# Patient Record
Sex: Female | Born: 1960 | Race: Black or African American | Hispanic: No | Marital: Married
Health system: Southern US, Community
[De-identification: ages and names within clinical notes are randomized; demographics above are authoritative.]

## PROBLEM LIST (undated history)

## (undated) DIAGNOSIS — D649 Anemia, unspecified: Secondary | ICD-10-CM

## (undated) HISTORY — PX: ECTOPIC PREGNANCY SURGERY: SHX613

## (undated) HISTORY — PX: OTHER SURGICAL HISTORY: SHX169

## (undated) HISTORY — PX: ABDOMINAL HYSTERECTOMY: SHX81

## (undated) HISTORY — PX: COLONOSCOPY: SHX174

---

## 2005-08-07 ENCOUNTER — Ambulatory Visit: Payer: Self-pay | Admitting: Family Medicine

## 2006-08-21 ENCOUNTER — Ambulatory Visit: Payer: Self-pay | Admitting: Family Medicine

## 2007-08-24 ENCOUNTER — Ambulatory Visit: Payer: Self-pay | Admitting: Family Medicine

## 2008-09-08 ENCOUNTER — Ambulatory Visit: Payer: Self-pay | Admitting: Family Medicine

## 2009-09-12 ENCOUNTER — Ambulatory Visit: Payer: Self-pay | Admitting: Family Medicine

## 2010-02-23 ENCOUNTER — Emergency Department: Payer: Self-pay | Admitting: Unknown Physician Specialty

## 2010-03-26 ENCOUNTER — Ambulatory Visit: Payer: Self-pay | Admitting: Gastroenterology

## 2010-09-17 ENCOUNTER — Ambulatory Visit: Payer: Self-pay | Admitting: Family Medicine

## 2011-07-13 ENCOUNTER — Emergency Department: Payer: Self-pay | Admitting: Unknown Physician Specialty

## 2011-10-10 ENCOUNTER — Ambulatory Visit: Payer: Self-pay | Admitting: Family Medicine

## 2012-07-07 ENCOUNTER — Emergency Department: Payer: Self-pay | Admitting: Emergency Medicine

## 2012-11-11 ENCOUNTER — Emergency Department: Payer: Self-pay | Admitting: Emergency Medicine

## 2012-11-11 LAB — CK TOTAL AND CKMB (NOT AT ARMC)
CK, Total: 130 U/L (ref 21–215)
CK-MB: <0.5 ng/mL — ABNORMAL LOW (ref 0.5–3.6)

## 2012-11-11 LAB — COMPREHENSIVE METABOLIC PANEL
Albumin: 4.4 g/dL (ref 3.4–5.0)
Alkaline Phosphatase: 75 U/L (ref 50–136)
Anion Gap: 5 — ABNORMAL LOW (ref 7–16)
Bilirubin,Total: 0.3 mg/dL (ref 0.2–1.0)
Calcium, Total: 9 mg/dL (ref 8.5–10.1)
Glucose: 89 mg/dL (ref 65–99)
Osmolality: 274 (ref 275–301)
Potassium: 4.1 mmol/L (ref 3.5–5.1)
SGOT(AST): 20 U/L (ref 15–37)
SGPT (ALT): 19 U/L (ref 12–78)
Total Protein: 8.2 g/dL (ref 6.4–8.2)

## 2012-11-11 LAB — TROPONIN I: Troponin-I: <0.02 ng/mL

## 2012-11-11 LAB — CBC
MCHC: 32.7 g/dL (ref 32.0–36.0)
MCV: 101 fL — ABNORMAL HIGH (ref 80–100)
RBC: 3.81 10*6/uL (ref 3.80–5.20)
RDW: 13.6 % (ref 11.5–14.5)

## 2013-07-18 ENCOUNTER — Ambulatory Visit: Payer: Self-pay | Admitting: Family Medicine

## 2015-07-03 ENCOUNTER — Other Ambulatory Visit: Payer: Self-pay | Admitting: Family Medicine

## 2015-07-03 DIAGNOSIS — Z1231 Encounter for screening mammogram for malignant neoplasm of breast: Secondary | ICD-10-CM

## 2015-07-05 ENCOUNTER — Encounter: Payer: Self-pay | Admitting: *Deleted

## 2015-07-06 ENCOUNTER — Ambulatory Visit
Admission: RE | Admit: 2015-07-06 | Discharge: 2015-07-06 | Disposition: A | Discharge status: Home / Self Care | Payer: 59 | Source: Ambulatory Visit | Attending: Gastroenterology | Admitting: Gastroenterology

## 2015-07-06 ENCOUNTER — Ambulatory Visit: Payer: 59 | Admitting: Anesthesiology

## 2015-07-06 ENCOUNTER — Encounter
Admission: RE | Disposition: A | Discharge status: Home / Self Care | Payer: Self-pay | Source: Ambulatory Visit | Attending: Gastroenterology

## 2015-07-06 ENCOUNTER — Encounter: Payer: Self-pay | Admitting: Anesthesiology

## 2015-07-06 DIAGNOSIS — Z1211 Encounter for screening for malignant neoplasm of colon: Secondary | ICD-10-CM | POA: Diagnosis present

## 2015-07-06 DIAGNOSIS — K621 Rectal polyp: Secondary | ICD-10-CM | POA: Diagnosis not present

## 2015-07-06 DIAGNOSIS — Z800 Family history of malignant neoplasm of digestive organs: Secondary | ICD-10-CM | POA: Diagnosis not present

## 2015-07-06 DIAGNOSIS — K635 Polyp of colon: Secondary | ICD-10-CM | POA: Diagnosis not present

## 2015-07-06 HISTORY — DX: Anemia, unspecified: D64.9

## 2015-07-06 HISTORY — PX: COLONOSCOPY WITH PROPOFOL: SHX5780

## 2015-07-06 SURGERY — COLONOSCOPY WITH PROPOFOL
Anesthesia: General

## 2015-07-06 MED ORDER — PROPOFOL 500 MG/50ML IV EMUL
INTRAVENOUS | Status: DC | PRN
  Administered 2015-07-06: 150 ug/kg/min via INTRAVENOUS

## 2015-07-06 MED ORDER — LIDOCAINE HCL (CARDIAC) 20 MG/ML IV SOLN
INTRAVENOUS | Status: DC | PRN
  Administered 2015-07-06: 100 mg via INTRAVENOUS

## 2015-07-06 MED ORDER — SODIUM CHLORIDE 0.9 % IV SOLN
INTRAVENOUS | Status: DC
  Administered 2015-07-06: 12:00:00 via INTRAVENOUS

## 2015-07-06 MED ORDER — LIDOCAINE HCL (PF) 1 % IJ SOLN: 2.0000 mL | Freq: Once | INTRAMUSCULAR | Status: DC

## 2015-07-06 MED ORDER — PROPOFOL 10 MG/ML IV BOLUS
INTRAVENOUS | Status: DC | PRN
  Administered 2015-07-06: 80 mg via INTRAVENOUS

## 2015-07-06 MED ORDER — LIDOCAINE HCL (PF) 1 % IJ SOLN
INTRAMUSCULAR | Status: AC
  Filled 2015-07-06: qty 2

## 2015-07-06 NOTE — H&P (Signed)
    Primary Care Physician:  Jerl MinaHEDRICK, JAMES, MD Primary Gastroenterologist:  Dr. Bluford Kaufmannh  Pre-Procedure History & Physical: HPI:  Terri Harvey is a 54 y.o. female is here for an colonoscopy.   Past Medical History  Diagnosis Date  . Anemia     Past Surgical History  Procedure Laterality Date  . Abdominal hysterectomy    . Left knee arthroscopy Left   . Ectopic pregnancy surgery    . Colonoscopy      Prior to Admission medications   Medication Sig Start Date End Date Taking? Authorizing Provider  fluticasone (FLONASE) 50 MCG/ACT nasal spray Place into both nostrils daily.   Yes Historical Provider, MD    Allergies as of 05/18/2015  . (Not on File)    No family history on file.  Social History   Social History  . Marital Status: Married    Spouse Name: N/A  . Number of Children: N/A  . Years of Education: N/A   Occupational History  . Not on file.   Social History Main Topics  . Smoking status: Not on file  . Smokeless tobacco: Never Used  . Alcohol Use: No  . Drug Use: No  . Sexual Activity: Not on file   Other Topics Concern  . Not on file   Social History Narrative  . No narrative on file    Review of Systems: See HPI, otherwise negative ROS  Physical Exam: There were no vitals taken for this visit. General:   Alert,  pleasant and cooperative in NAD Head:  Normocephalic and atraumatic. Neck:  Supple; no masses or thyromegaly. Lungs:  Clear throughout to auscultation.    Heart:  Regular rate and rhythm. Abdomen:  Soft, nontender and nondistended. Normal bowel sounds, without guarding, and without rebound.   Neurologic:  Alert and  oriented x4;  grossly normal neurologically.  Impression/Plan: Terri Harvey is here for an colononoscopy to be performed for family hx of colon cancer.  Risks, benefits, limitations, and alternatives regarding  colonoscopy have been reviewed with the patient.  Questions have been answered.  All parties agreeable.   Cody Albus,  Ezzard StandingPAUL Y, MD  07/06/2015, 10:41 AM

## 2015-07-06 NOTE — Transfer of Care (Signed)
Immediate Anesthesia Transfer of Care Note  Patient: Terri Harvey  Procedure(s) Performed: Procedure(s): COLONOSCOPY WITH PROPOFOL (N/A)  Patient Location: Endoscopy Unit  Anesthesia Type:General  Level of Consciousness: sedated  Airway & Oxygen Therapy: Patient Spontanous Breathing and Patient connected to nasal cannula oxygen  Post-op Assessment: Report given to RN and Post -op Vital signs reviewed and stable  Post vital signs: Reviewed and stable  Last Vitals: There were no vitals filed for this visit.  Complications: No apparent anesthesia complications

## 2015-07-06 NOTE — Anesthesia Preprocedure Evaluation (Signed)
Anesthesia Evaluation  Patient identified by MRN, date of birth, ID band Patient awake    Reviewed: Allergy & Precautions, H&P , NPO status , Patient's Chart, lab work & pertinent test results  History of Anesthesia Complications (+) DIFFICULT IV STICK / SPECIAL LINE and history of anesthetic complications  Airway Mallampati: II  TM Distance: >3 FB Neck ROM: full    Dental no notable dental hx. (+) Teeth Intact   Pulmonary neg pulmonary ROS,    Pulmonary exam normal breath sounds clear to auscultation       Cardiovascular Exercise Tolerance: Good (-) angina(-) Past MI and (-) DOE negative cardio ROS Normal cardiovascular exam Rhythm:regular Rate:Normal     Neuro/Psych negative neurological ROS  negative psych ROS   GI/Hepatic negative GI ROS, Neg liver ROS, neg GERD  ,  Endo/Other  negative endocrine ROS  Renal/GU negative Renal ROS  negative genitourinary   Musculoskeletal   Abdominal   Peds  Hematology negative hematology ROS (+)   Anesthesia Other Findings Past Medical History:   Anemia                                                      Past Surgical History:   ABDOMINAL HYSTERECTOMY                                        left knee arthroscopy                           Left              ECTOPIC PREGNANCY SURGERY                                     COLONOSCOPY                                                  BMI    Body Mass Index   29.06 kg/m 2      Reproductive/Obstetrics negative OB ROS                             Anesthesia Physical Anesthesia Plan  ASA: II  Anesthesia Plan: General   Post-op Pain Management:    Induction:   Airway Management Planned:   Additional Equipment:   Intra-op Plan:   Post-operative Plan:   Informed Consent: I have reviewed the patients History and Physical, chart, labs and discussed the procedure including the risks, benefits and  alternatives for the proposed anesthesia with the patient or authorized representative who has indicated his/her understanding and acceptance.   Dental Advisory Given  Plan Discussed with: Anesthesiologist, CRNA and Surgeon  Anesthesia Plan Comments:         Anesthesia Quick Evaluation

## 2015-07-06 NOTE — Op Note (Signed)
[facility] Patient Name: Terri Harvey Date: 07/06/2015 11:41 AM MRN: [id number] Account #: [id number] Date of Birth: November 28, 1960 Admit Type: Outpatient Age: [age] [facility] Gender: Female Note Status: Finalized Harvey:         Colonoscopy Indications:       Screening in patient at increased risk: Family history of                     1st-degree relative with colorectal cancer Providers:         Ezzard StandingPaul Y. Bluford Kaufmannh, MD Referring MD:      Rhona LeavensJames F. Burnett ShengHedrick, MD (Referring MD) Medicines:         Monitored Anesthesia Care Complications:     No immediate complications. Harvey:         Pre-Anesthesia Assessment:                    - Prior to the Harvey, a History and Physical was                     performed, and patient medications, allergies and                     sensitivities were reviewed. The patient's tolerance of                     previous anesthesia was reviewed.                    - The risks and benefits of the Harvey and the sedation                     options and risks were discussed with the patient. All                     questions were answered and informed consent was obtained.                    - After reviewing the risks and benefits, the patient was                     deemed in satisfactory condition to undergo the Harvey.                    After obtaining informed consent, the colonoscope was                     passed under direct vision. Throughout the Harvey, the                     patient's blood pressure, pulse, and oxygen saturations                     were monitored continuously. The Colonoscope was                     introduced through the anus and advanced to the the cecum,                     identified by appendiceal orifice and ileocecal valve. The                     colonoscopy was performed with moderate difficulty due to  restricted mobility of the colon.  Successful completion of                     the Harvey was aided by using manual pressure. Findings:      A diminutive polyp was found in the sigmoid colon. The polyp was       sessile. The polyp was removed with a jumbo cold forceps. Resection and       retrieval were complete.      Three sessile polyps were found in the rectum. The polyps were       diminutive in size. These polyps were removed with a jumbo cold forceps.       Resection and retrieval were complete.      The exam was otherwise without abnormality. Impression:        - One diminutive polyp in the sigmoid colon. Resected and                     retrieved.                    - Three diminutive polyps in the rectum. Resected and                     retrieved.                    - The examination was otherwise normal. Recommendation:    - Discharge patient to home.                    - Await pathology results.                    - Repeat colonoscopy in 5 years for surveillance.                    - The findings and recommendations were discussed with the                     patient. Harvey Code(s): --- Professional ---                    [phone], Colonoscopy, flexible; with biopsy, single or                     multiple Diagnosis Code(s): --- Professional ---                    Z80.0, Family history of malignant neoplasm of digestive                     organs                    D12.5, Benign neoplasm of sigmoid colon                    K62.1, Rectal polyp CPT copyright 2014 [employer]. All rights reserved. The codes documented in this report are preliminary and upon coder review may  be revised to meet current compliance requirements. Wallace Cullens, MD 07/06/2015 12:03:01 PM This report has been signed electronically. Number of Addenda: 0 Note Initiated On: 07/06/2015 11:41 AM Scope Withdrawal Time: 0 hours 5 minutes 37 seconds  Total Harvey Duration: 0 hours 15 minutes 29 seconds        [facility]

## 2015-07-06 NOTE — Anesthesia Postprocedure Evaluation (Signed)
  Anesthesia Post-op Note  Patient: Terri StandardSusan Martinovich  Procedure(s) Performed: Procedure(s): COLONOSCOPY WITH PROPOFOL (N/A)  Anesthesia type:General  Patient location: PACU  Post pain: Pain level controlled  Post assessment: Post-op Vital signs reviewed, Patient's Cardiovascular Status Stable, Respiratory Function Stable, Patent Airway and No signs of Nausea or vomiting  Post vital signs: Reviewed and stable  Last Vitals:  Filed Vitals:   07/06/15 1240  BP: 118/67  Pulse: 60  Temp:   Resp: 12    Level of consciousness: awake, alert  and patient cooperative  Complications: No apparent anesthesia complications

## 2015-07-09 LAB — SURGICAL PATHOLOGY

## 2015-07-11 ENCOUNTER — Encounter: Payer: Self-pay | Admitting: Gastroenterology

## 2015-08-15 ENCOUNTER — Ambulatory Visit
Admission: RE | Admit: 2015-08-15 | Discharge: 2015-08-15 | Disposition: A | Discharge status: Home / Self Care | Payer: 59 | Source: Ambulatory Visit | Attending: Family Medicine | Admitting: Family Medicine

## 2015-08-15 DIAGNOSIS — Z1231 Encounter for screening mammogram for malignant neoplasm of breast: Secondary | ICD-10-CM | POA: Diagnosis present

## 2016-08-19 ENCOUNTER — Other Ambulatory Visit: Payer: Self-pay | Admitting: Family Medicine

## 2016-08-19 DIAGNOSIS — Z1231 Encounter for screening mammogram for malignant neoplasm of breast: Secondary | ICD-10-CM

## 2016-10-02 ENCOUNTER — Ambulatory Visit: Payer: 59

## 2016-11-10 ENCOUNTER — Ambulatory Visit: Payer: 59 | Attending: Family Medicine

## 2018-01-06 ENCOUNTER — Other Ambulatory Visit: Payer: Self-pay | Admitting: Family Medicine

## 2018-01-06 DIAGNOSIS — Z1231 Encounter for screening mammogram for malignant neoplasm of breast: Secondary | ICD-10-CM

## 2018-03-05 ENCOUNTER — Ambulatory Visit
Admission: RE | Admit: 2018-03-05 | Discharge: 2018-03-05 | Disposition: A | Discharge status: Home / Self Care | Payer: Managed Care, Other (non HMO) | Source: Ambulatory Visit | Attending: Family Medicine | Admitting: Family Medicine

## 2018-03-05 DIAGNOSIS — Z1231 Encounter for screening mammogram for malignant neoplasm of breast: Secondary | ICD-10-CM | POA: Insufficient documentation

## 2020-01-04 ENCOUNTER — Other Ambulatory Visit: Payer: Self-pay | Admitting: Family Medicine

## 2020-01-04 DIAGNOSIS — Z1231 Encounter for screening mammogram for malignant neoplasm of breast: Secondary | ICD-10-CM

## 2020-01-09 ENCOUNTER — Ambulatory Visit
Admission: RE | Admit: 2020-01-09 | Discharge: 2020-01-09 | Disposition: A | Discharge status: Home / Self Care | Payer: Managed Care, Other (non HMO) | Source: Ambulatory Visit | Attending: Family Medicine | Admitting: Family Medicine

## 2020-01-09 DIAGNOSIS — Z1231 Encounter for screening mammogram for malignant neoplasm of breast: Secondary | ICD-10-CM | POA: Insufficient documentation

## 2020-02-14 ENCOUNTER — Ambulatory Visit: Payer: Managed Care, Other (non HMO) | Attending: Internal Medicine

## 2020-02-14 DIAGNOSIS — Z23 Encounter for immunization: Secondary | ICD-10-CM

## 2020-02-14 NOTE — Progress Notes (Signed)
   Covid-19 Vaccination Clinic  Name:  Terri Harvey    MRN: [id number] DOB: 1961/07/01  02/14/2020  [name] was observed post Covid-19 immunization for 15 minutes without incident. She was provided with Vaccine Information Sheet and instruction to access the V-Safe system.   [name] was instructed to call 911 with any severe reactions post vaccine: Marland Kitchen Difficulty breathing  . Swelling of face and throat  . A fast heartbeat  . A bad rash all over body  . Dizziness and weakness   Immunizations Administered    Name Date Dose VIS Date Route   Pfizer COVID-19 Vaccine 02/14/2020  5:57 PM 0.3 mL 11/16/2018 Intramuscular   Manufacturer: [employer], [employer]   Lot: [id number]   NDC: [address]-5

## 2020-03-06 ENCOUNTER — Ambulatory Visit: Payer: Managed Care, Other (non HMO) | Attending: Internal Medicine

## 2020-03-06 DIAGNOSIS — Z23 Encounter for immunization: Secondary | ICD-10-CM

## 2020-03-06 NOTE — Progress Notes (Signed)
   Covid-19 Vaccination Clinic  Name:  Terri Harvey    MRN: [id number] DOB: Aug 12, 1961  03/06/2020  [name] was observed post Covid-19 immunization for 15 minutes without incident. She was provided with Vaccine Information Sheet and instruction to access the V-Safe system.   [name] was instructed to call 911 with any severe reactions post vaccine: Marland Kitchen Difficulty breathing  . Swelling of face and throat  . A fast heartbeat  . A bad rash all over body  . Dizziness and weakness   Immunizations Administered    Name Date Dose VIS Date Route   Pfizer COVID-19 Vaccine 03/06/2020  3:50 PM 0.3 mL 11/16/2018 Intramuscular   Manufacturer: [employer], [employer]   Lot: [id number]   NDC: [address]-7

## 2021-01-08 ENCOUNTER — Other Ambulatory Visit: Payer: Self-pay | Admitting: Family Medicine

## 2021-01-08 DIAGNOSIS — Z1231 Encounter for screening mammogram for malignant neoplasm of breast: Secondary | ICD-10-CM

## 2021-01-15 ENCOUNTER — Ambulatory Visit
Admission: RE | Admit: 2021-01-15 | Discharge: 2021-01-15 | Disposition: A | Discharge status: Home / Self Care | Payer: Managed Care, Other (non HMO) | Source: Ambulatory Visit | Attending: Family Medicine | Admitting: Family Medicine

## 2021-01-15 ENCOUNTER — Other Ambulatory Visit: Payer: Self-pay

## 2021-01-15 DIAGNOSIS — Z1231 Encounter for screening mammogram for malignant neoplasm of breast: Secondary | ICD-10-CM | POA: Insufficient documentation

## 2021-05-21 LAB — SURGICAL PATHOLOGY

## 2022-03-04 IMAGING — MG DIGITAL SCREENING BILAT W/ TOMO W/ CAD
6 of 12 series · 6 of 36 positions shown · non-contrast
Comparison: Previous exam(s).

CLINICAL DATA: Screening.

EXAM:
DIGITAL SCREENING BILATERAL MAMMOGRAM WITH TOMO AND CAD

[R CC synth-2D]
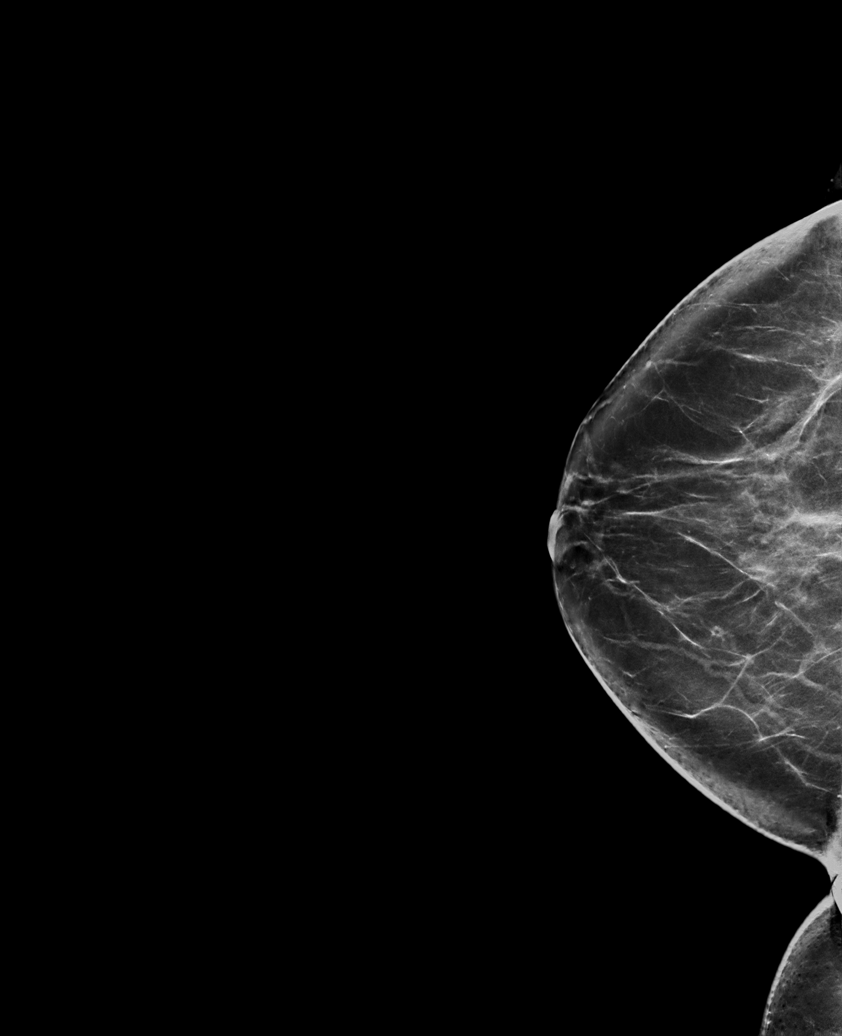

[R MLO synth-2D (1 of 2)]
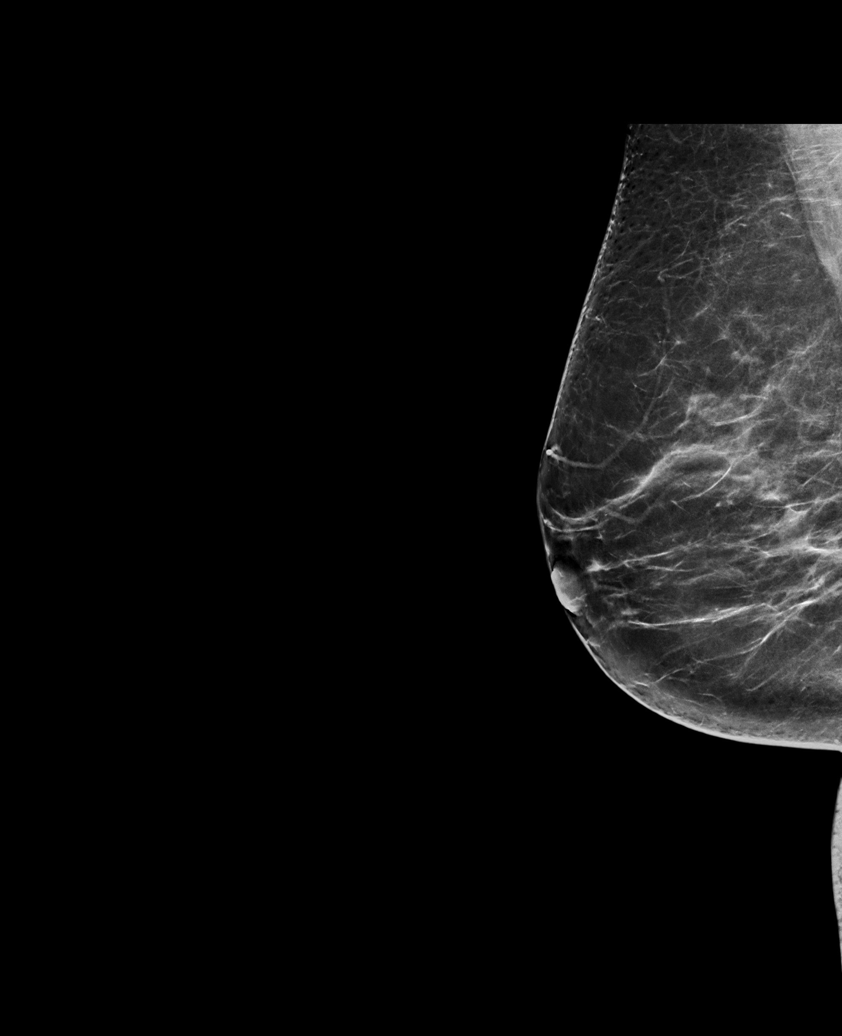

[R MLO synth-2D (2 of 2)]
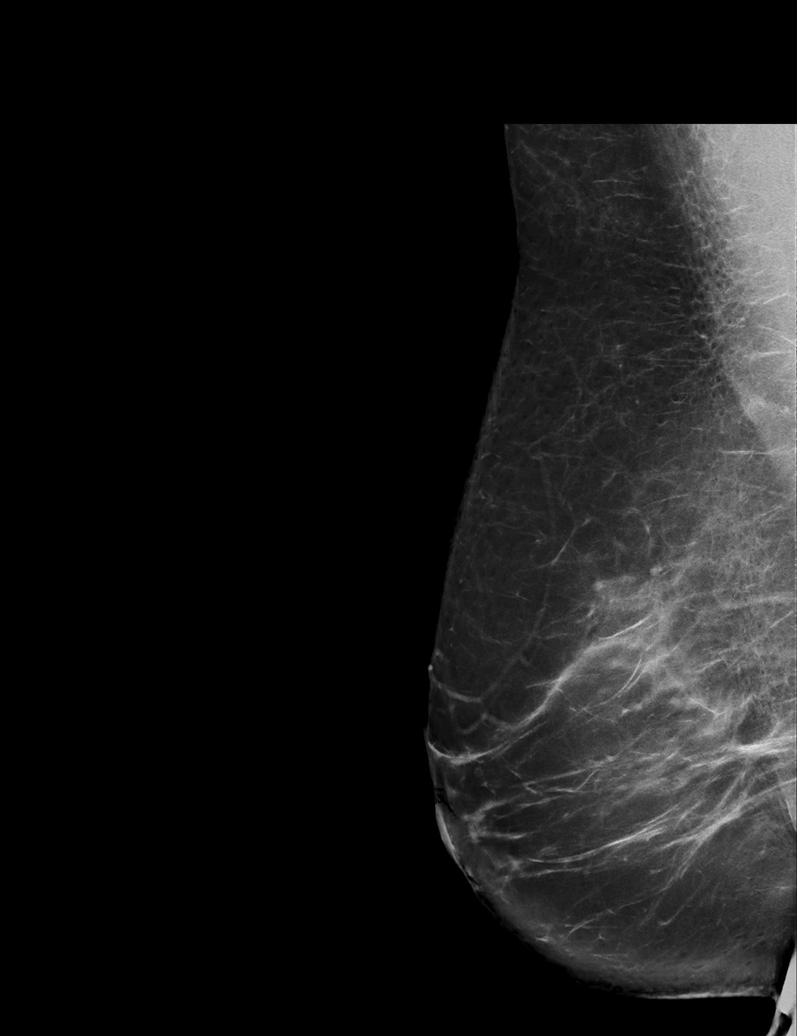

[L MLO synth-2D]
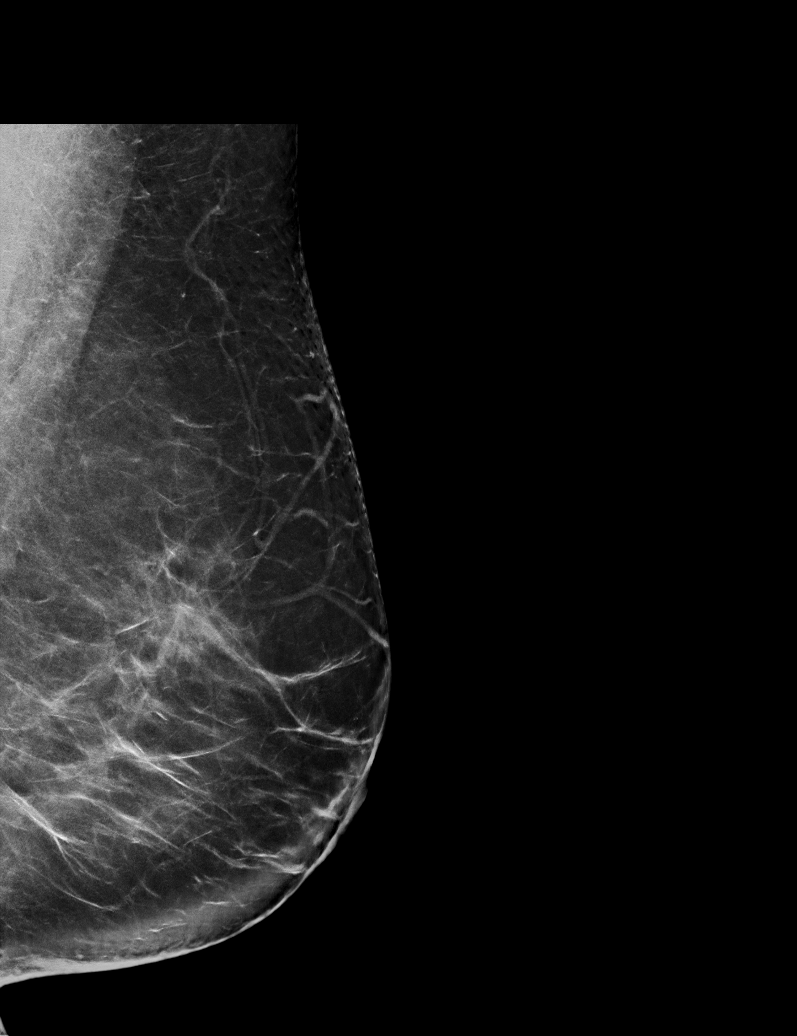

[L XCCL synth-2D]
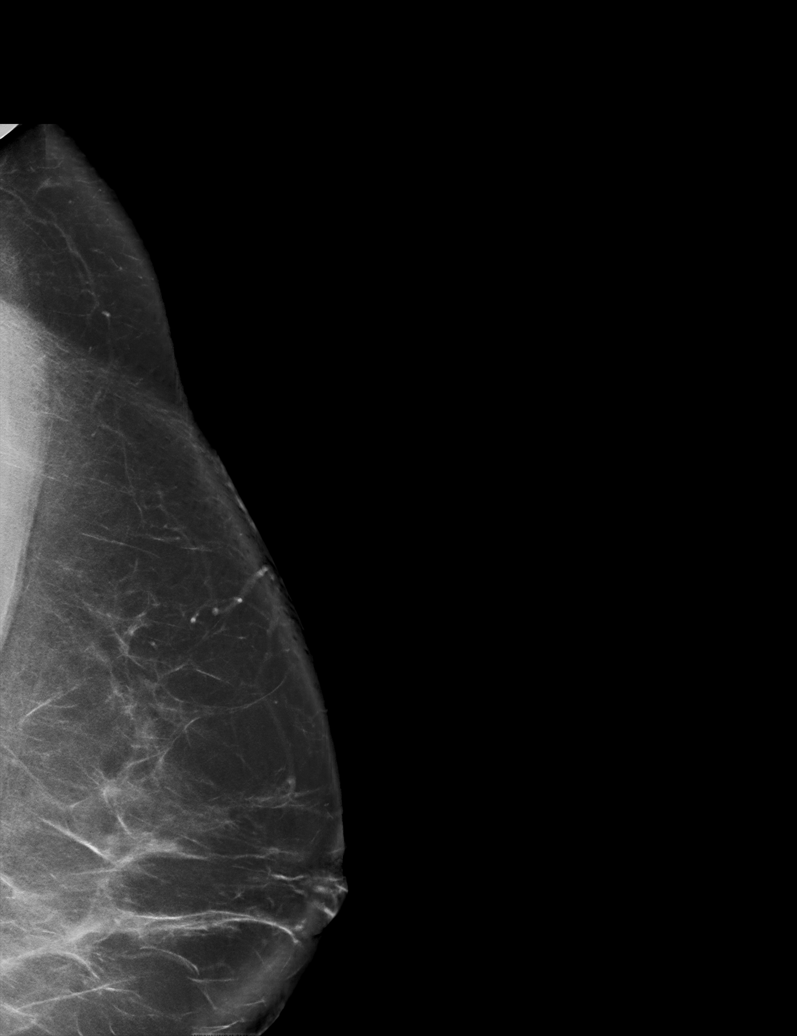

[L CC synth-2D]
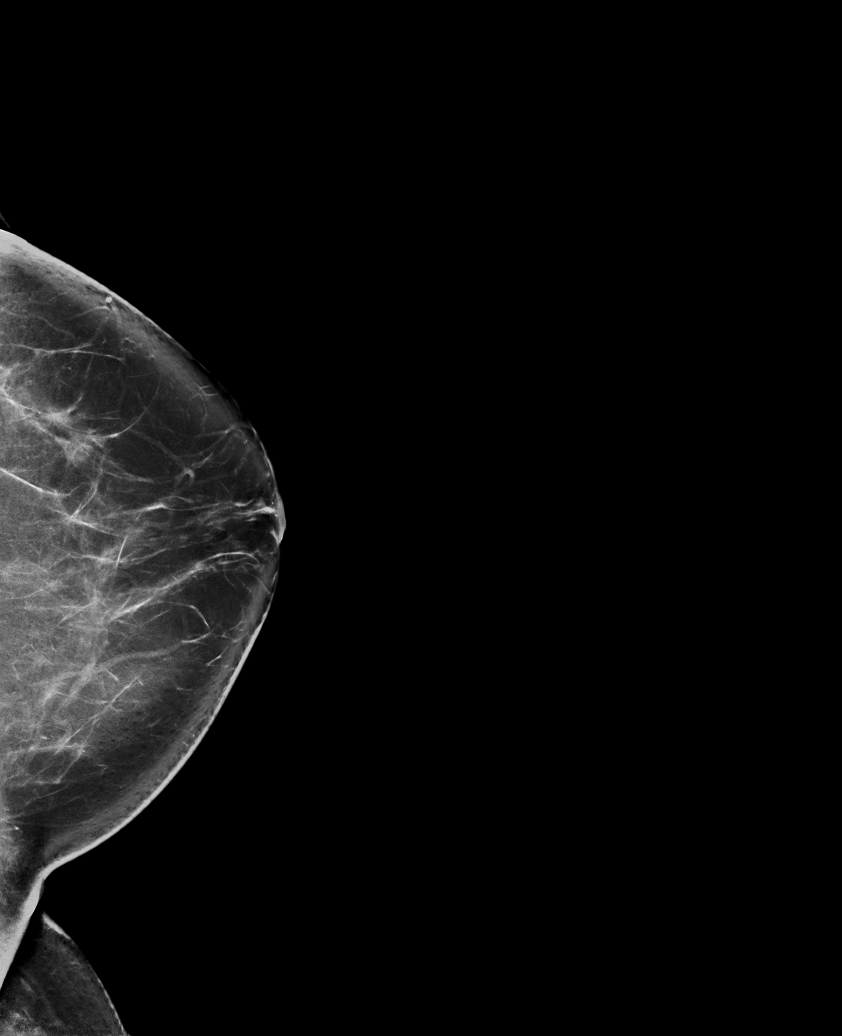

[6 of 36 positions shown; findings below may reference images not displayed]

ACR Breast Density Category c: The breast tissue is heterogeneously
dense, which may obscure small masses.
FINDINGS: There are no findings suspicious for malignancy. Images were
processed with CAD.
IMPRESSION: No mammographic evidence of malignancy. A result letter of this
screening mammogram will be mailed directly to the patient.

RECOMMENDATION:
Screening mammogram in one year. (Code:[id number])

BI-RADS CATEGORY  1: Negative.

## 2023-06-11 ENCOUNTER — Other Ambulatory Visit: Payer: Self-pay | Admitting: Family Medicine

## 2023-06-11 DIAGNOSIS — Z1231 Encounter for screening mammogram for malignant neoplasm of breast: Secondary | ICD-10-CM

## 2023-07-20 ENCOUNTER — Ambulatory Visit
Admission: RE | Admit: 2023-07-20 | Discharge: 2023-07-20 | Disposition: A | Discharge status: Home / Self Care | Payer: Managed Care, Other (non HMO) | Source: Ambulatory Visit | Attending: Family Medicine | Admitting: Family Medicine

## 2023-07-20 DIAGNOSIS — Z1231 Encounter for screening mammogram for malignant neoplasm of breast: Secondary | ICD-10-CM | POA: Insufficient documentation

## 2024-07-28 ENCOUNTER — Other Ambulatory Visit: Payer: Self-pay | Admitting: Family Medicine

## 2024-07-28 DIAGNOSIS — Z1231 Encounter for screening mammogram for malignant neoplasm of breast: Secondary | ICD-10-CM

## 2024-08-25 ENCOUNTER — Ambulatory Visit
Admission: RE | Admit: 2024-08-25 | Discharge: 2024-08-25 | Disposition: A | Source: Ambulatory Visit | Attending: Family Medicine | Admitting: Family Medicine

## 2024-08-25 DIAGNOSIS — Z1231 Encounter for screening mammogram for malignant neoplasm of breast: Secondary | ICD-10-CM | POA: Diagnosis present
